# Patient Record
Sex: Female | Born: 1985 | Race: Black or African American | Hispanic: No | Marital: Single | Smoking: Never smoker
Health system: Southern US, Community
[De-identification: ages and names within clinical notes are randomized; demographics above are authoritative.]

---
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
## 2006-04-03 ENCOUNTER — Other Ambulatory Visit: Admission: RE | Admit: 2006-04-03 | Discharge: 2006-04-03 | Payer: Self-pay | Admitting: Unknown Physician Specialty

## 2006-04-03 ENCOUNTER — Encounter (INDEPENDENT_AMBULATORY_CARE_PROVIDER_SITE_OTHER): Payer: Self-pay | Admitting: Specialist

## 2007-02-12 ENCOUNTER — Encounter (INDEPENDENT_AMBULATORY_CARE_PROVIDER_SITE_OTHER): Payer: Self-pay | Admitting: Unknown Physician Specialty

## 2007-02-12 ENCOUNTER — Other Ambulatory Visit: Admission: RE | Admit: 2007-02-12 | Discharge: 2007-02-12 | Payer: Self-pay | Admitting: Unknown Physician Specialty

## 2007-09-01 ENCOUNTER — Emergency Department (HOSPITAL_COMMUNITY): Admission: EM | Admit: 2007-09-01 | Discharge: 2007-09-01 | Payer: Self-pay | Admitting: Emergency Medicine

## 2007-11-26 ENCOUNTER — Other Ambulatory Visit: Admission: RE | Admit: 2007-11-26 | Discharge: 2007-11-26 | Payer: Self-pay | Admitting: Unknown Physician Specialty

## 2007-11-26 ENCOUNTER — Encounter (INDEPENDENT_AMBULATORY_CARE_PROVIDER_SITE_OTHER): Payer: Self-pay | Admitting: Unknown Physician Specialty

## 2008-07-13 ENCOUNTER — Emergency Department (HOSPITAL_COMMUNITY): Admission: EM | Admit: 2008-07-13 | Discharge: 2008-07-13 | Payer: Self-pay | Admitting: Emergency Medicine

## 2009-02-23 IMAGING — CT CT PELVIS W/O CM
1 of 2 series · 16 of 32 positions shown, 20 images · IV contrast (agent unspecified)
Comparison: None.

CLINICAL DATA: Right flank pain.  
 ABDOMEN CT WITHOUT CONTRAST:
TECHNIQUE: Multidetector CT imaging of the abdomen was performed following the standard protocol without IV contrast.
TECHNIQUE: Multidetector CT imaging of the pelvis was performed following the standard protocol without IV contrast.

[Series 2: stone 5.0 b40f · axial · 0.66mm/px · z∈[-243,+122]mm · 16 of 81 slices shown, 20 images]
[im 4/81  soft-tissue]
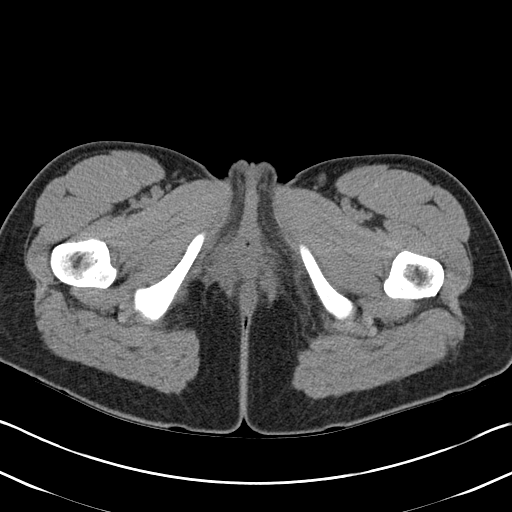
[im 4/81  bone]
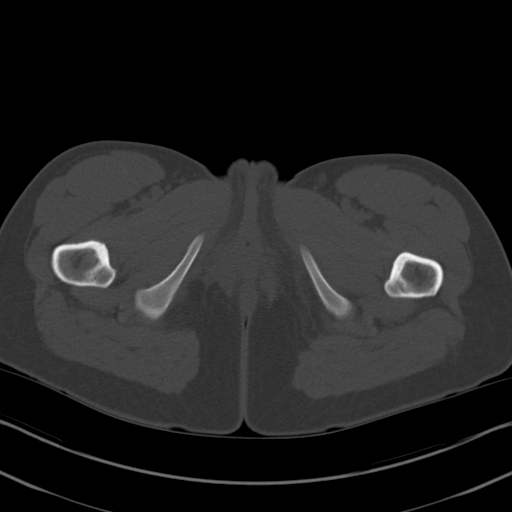
[im 10/81  soft-tissue]
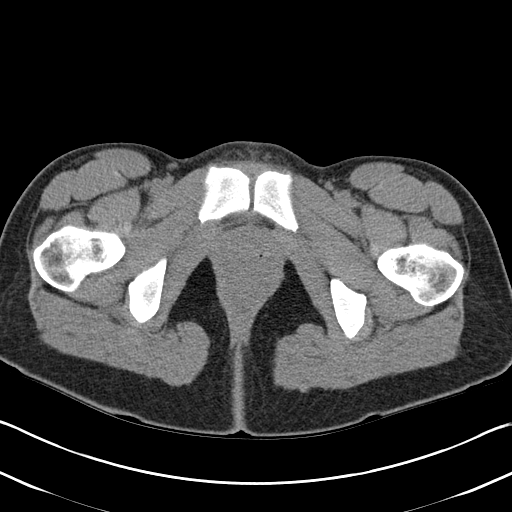
[im 17/81  soft-tissue]
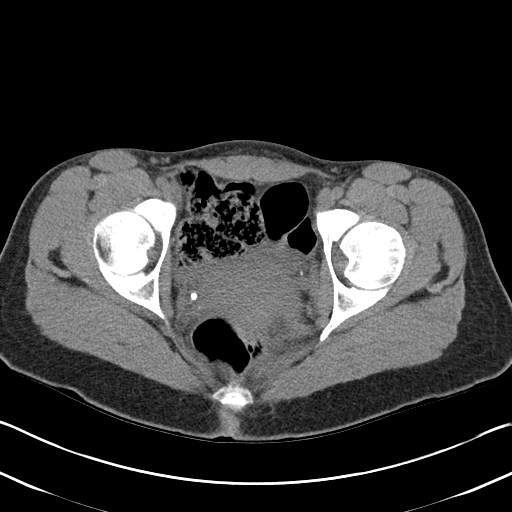
[im 23/81  soft-tissue]
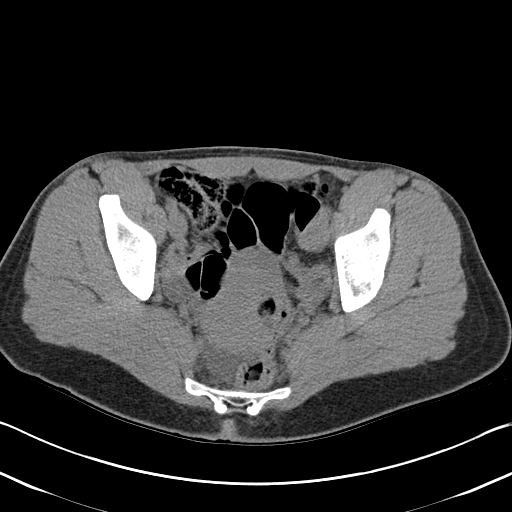
[im 26/81  soft-tissue]
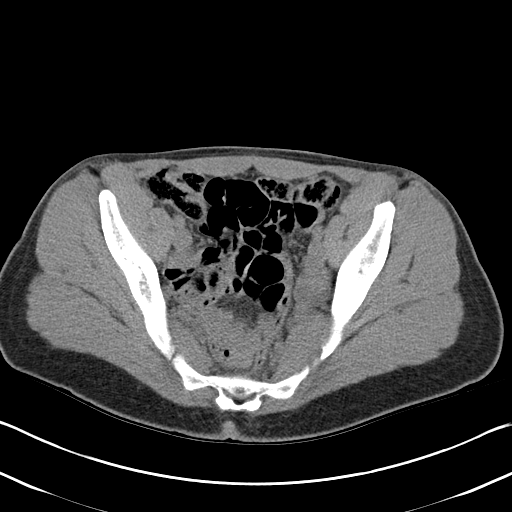
[im 33/81  soft-tissue]
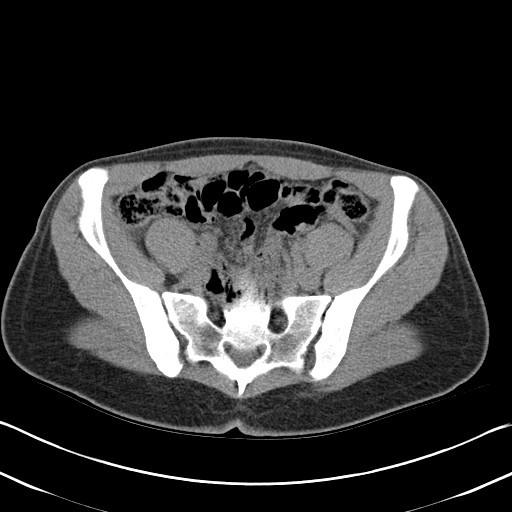
[im 39/81  soft-tissue]
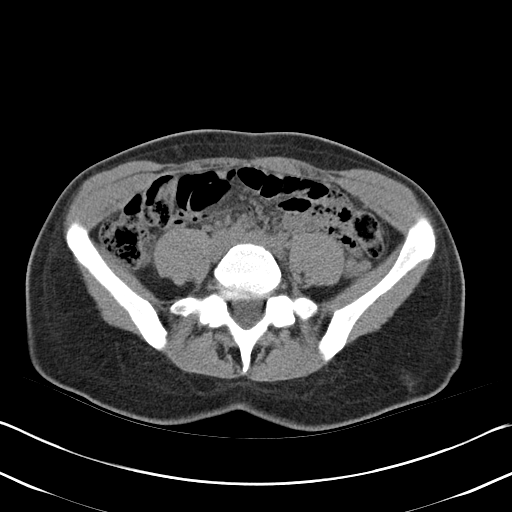
[im 42/81  soft-tissue]
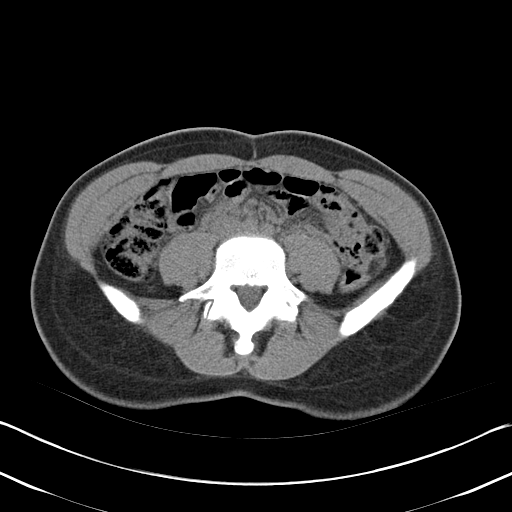
[im 49/81  soft-tissue]
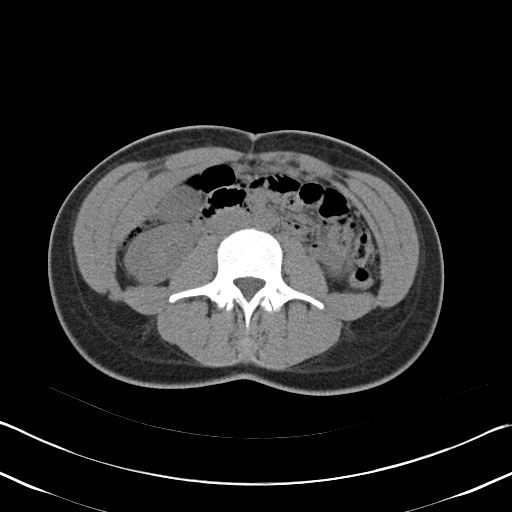
[im 49/81  bone]
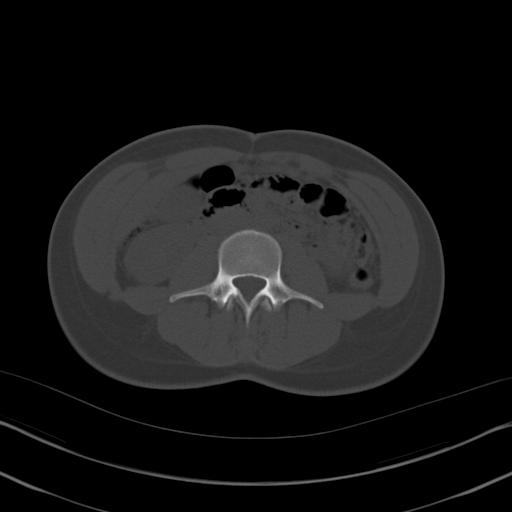
[im 55/81  soft-tissue]
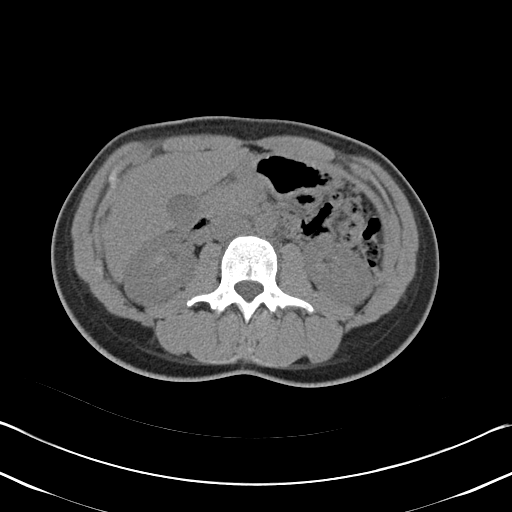
[im 61/81  soft-tissue]
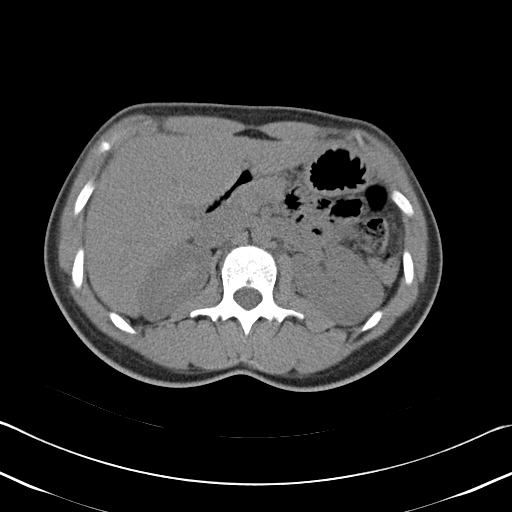
[im 65/81  soft-tissue]
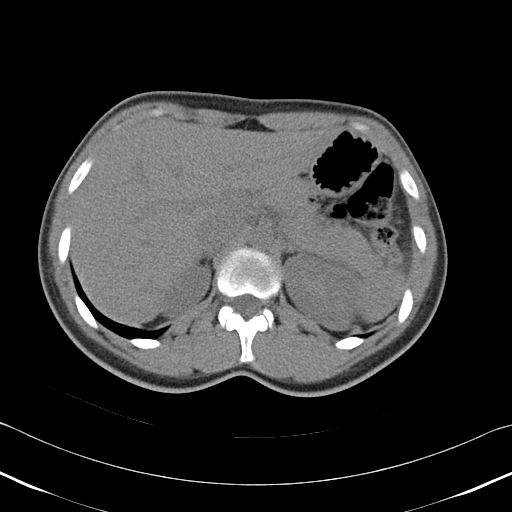
[im 68/81  lung]
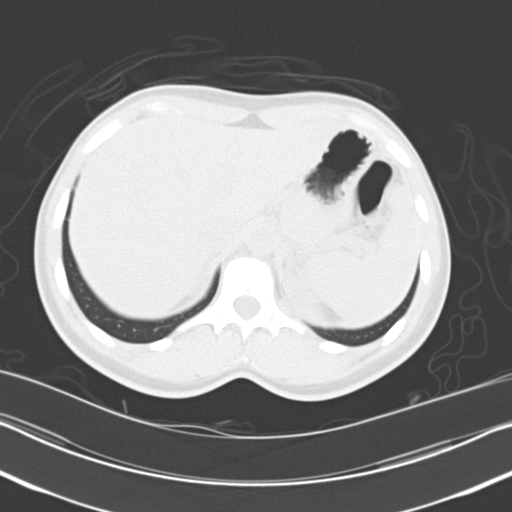
[im 71/81  soft-tissue]
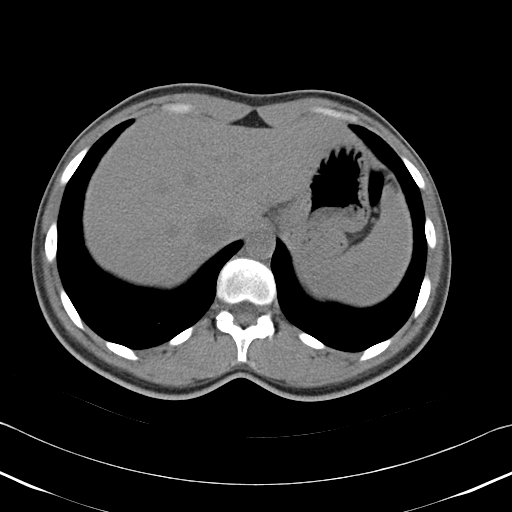
[im 71/81  lung]
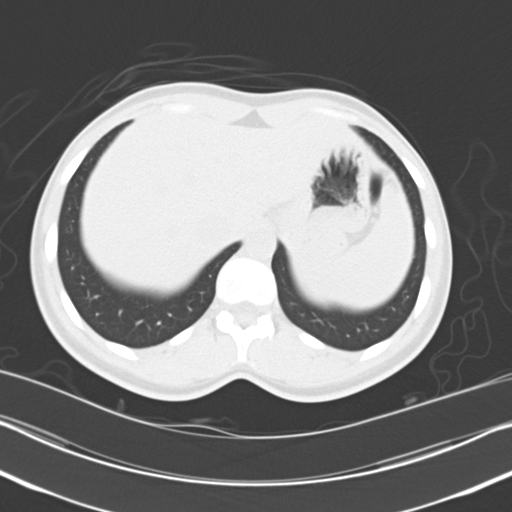
[im 74/81  lung]
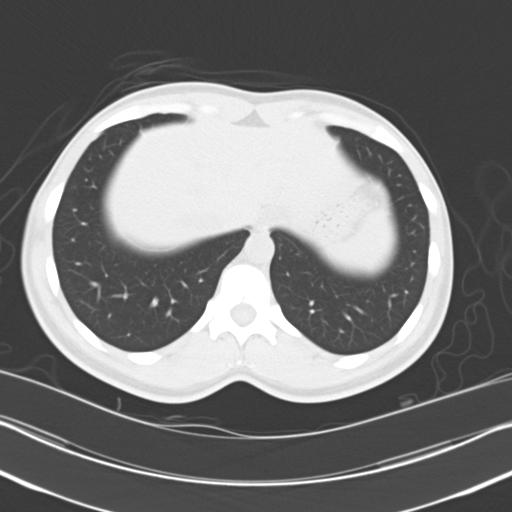
[im 77/81  soft-tissue]
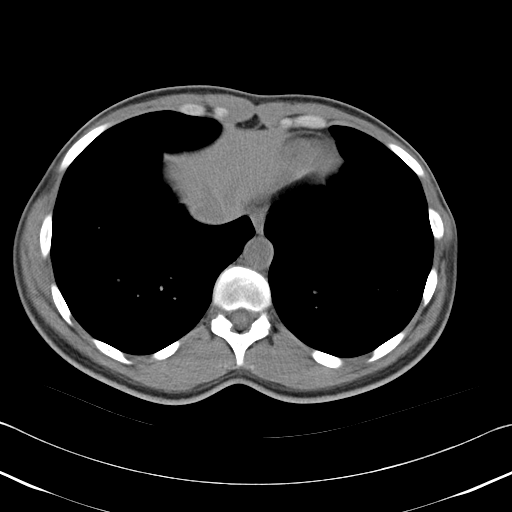
[im 77/81  lung]
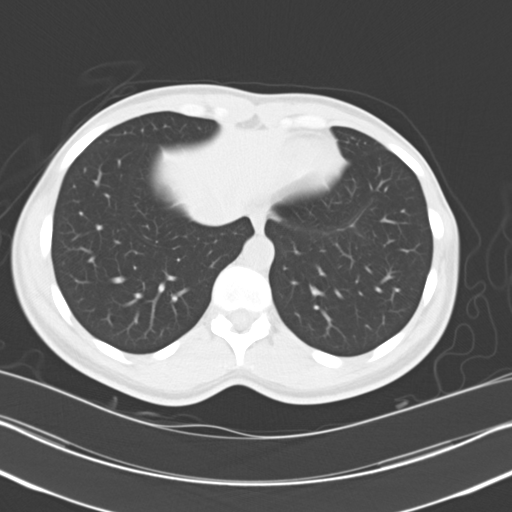

[16 of 32 positions shown; findings below may reference images not displayed]

FINDINGS: There is no hydronephrosis, asymmetrical perinephric stranding or nephrolithiasis.  No ureteral calculi are seen. The liver, gallbladder, spleen, pancreas, and adrenal glands are unremarkable.
IMPRESSION: No evidence of urinary calculus.  No hydronephrosis.  
 PELVIS CT WITHOUT CONTRAST:
FINDINGS: Free fluid is seen in the pelvis.  The uterus, adnexa, and bladder are unremarkable.  Calcifications on either side of the pelvis are noted, nonspecific.
IMPRESSION: Free fluid.  Nonspecific bilateral pelvic calcifications.

## 2010-01-05 IMAGING — CR DG CHEST 1V PORT
1 series · 1 of 1 positions shown · non-contrast
Comparison: Portable exam 8486 hours without priors for comparison.

CLINICAL DATA: Fever, shortness of breath, chest pain

PORTABLE CHEST - 1 VIEW

[view not recorded]
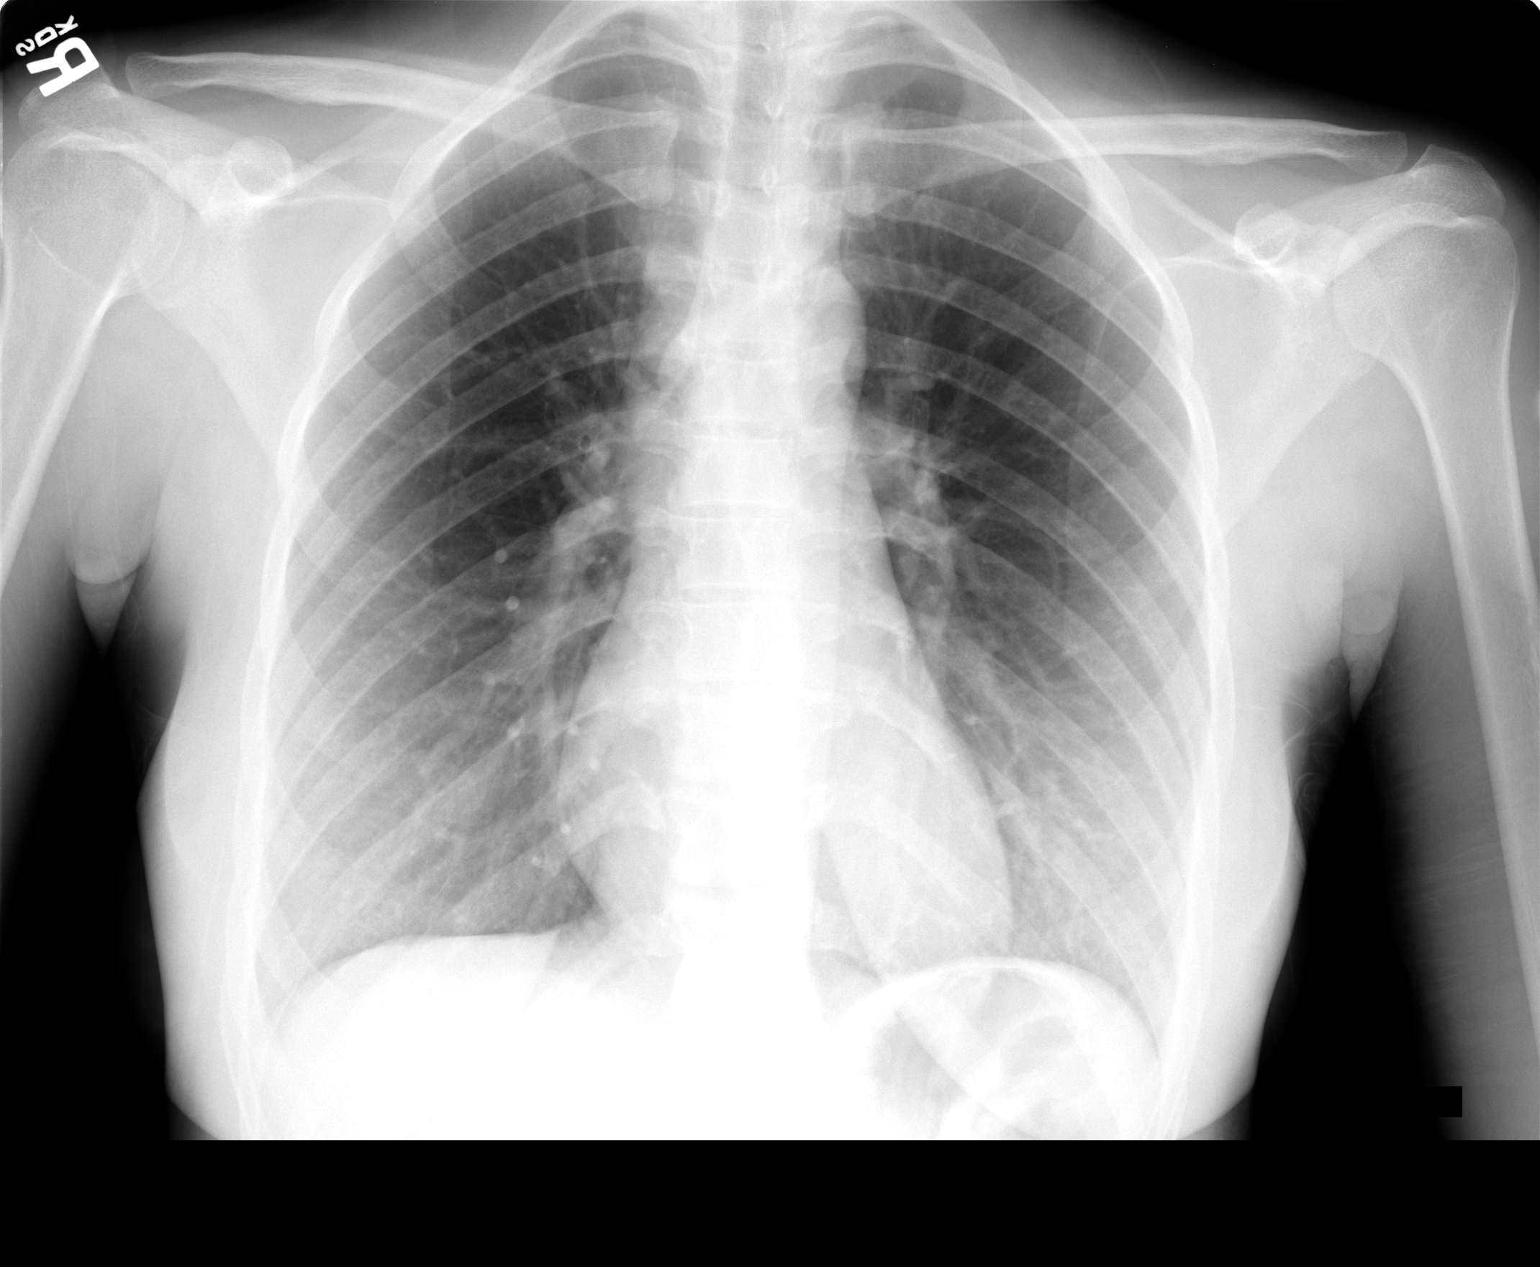

[1 of 1 positions shown; findings below may reference images not displayed]

FINDINGS: Normal heart size, mediastinal contours, and pulmonary vascularity.
Minimal peribronchial thickening without infiltrate or effusion.
No pneumothorax.
Bones unremarkable.
IMPRESSION: Minimal bronchitic changes.

## 2011-07-03 LAB — BASIC METABOLIC PANEL
BUN: 6
Creatinine, Ser: 0.71
GFR calc Af Amer: >60
GFR calc non Af Amer: >60
Glucose, Bld: 87
Potassium: 4.2

## 2011-07-03 LAB — DIFFERENTIAL
Basophils Absolute: 0
Eosinophils Relative: 2
Lymphocytes Relative: 34
Monocytes Absolute: 0.4
Monocytes Relative: 8
Neutrophils Relative %: 56

## 2011-07-03 LAB — URINALYSIS, ROUTINE W REFLEX MICROSCOPIC
Glucose, UA: NEGATIVE
pH: 5

## 2011-07-03 LAB — CBC
HCT: 38
Hemoglobin: 12.9
MCV: 88.9
RBC: 4.28
WBC: 5.3

## 2011-07-03 LAB — PREGNANCY, URINE: Preg Test, Ur: NEGATIVE

## 2013-12-09 ENCOUNTER — Ambulatory Visit: Payer: Self-pay | Admitting: Podiatry

## 2013-12-18 ENCOUNTER — Encounter: Payer: Self-pay | Admitting: Podiatry

## 2013-12-18 ENCOUNTER — Ambulatory Visit (INDEPENDENT_AMBULATORY_CARE_PROVIDER_SITE_OTHER): Payer: BC Managed Care – PPO

## 2013-12-18 ENCOUNTER — Ambulatory Visit (INDEPENDENT_AMBULATORY_CARE_PROVIDER_SITE_OTHER): Payer: BC Managed Care – PPO | Admitting: Podiatry

## 2013-12-18 DIAGNOSIS — M722 Plantar fascial fibromatosis: Secondary | ICD-10-CM

## 2013-12-18 DIAGNOSIS — R52 Pain, unspecified: Secondary | ICD-10-CM

## 2013-12-18 MED ORDER — METHYLPREDNISOLONE (PAK) 4 MG PO TABS: ORAL_TABLET | ORAL | Status: DC

## 2013-12-18 MED ORDER — MELOXICAM 15 MG PO TABS: 15.0000 mg | ORAL_TABLET | Freq: Every day | ORAL | Status: DC

## 2013-12-18 NOTE — Progress Notes (Signed)
   Subjective:    Patient ID: Julia Christian, female    DOB: [birth year], 28 y.o.   MRN: [id number]  HPI PT STATED BOTH FEET HAVING SHARP PAIN GOING UP THE LEG AND THIS BEEN GOING FOR 1 YEAR. THE FEET ARE GETTING WORSE. THE FEET GET AGGRAVATED WALKING/ SITTING. TRIED TO TAKE TYLENOL AND SOAK WITH EPSON SALT  BUT DID NOT HELP.    Review of Systems  HENT: Positive for sinus pressure.   Eyes: Positive for itching.  All other systems reviewed and are negative.       Objective:   Physical Exam: I have reviewed her past medical history medications allergies surgeries and social history. Review of systems is unremarkable. Vital signs are stable alert and oriented x3. Vascular evaluation demonstrates +2/4 DPPT bilateral. Capillary fill time to digits one through 5 is immediate. Neurologic sensorium is intact per Semmes-Weinstein monofilament. Deep tendon reflexes are brisk and equal bilateral. Muscle strength is 5 over 5 dorsiflexors plantar flexors inverters everters all intrinsic musculature is intact orthopedic evaluation demonstrates all joints distal to the ankle a full range of motion without crepitus mild pes planus is noted bilateral mild tenderness on palpation of the posterior tibial tendon bilaterally. Pain on palpation medial calcaneal tubercle at the plantar fascial calcaneal insertion sites bilateral but minimally radiographic evaluation does demonstrate after mentioned pes planus with soft tissue increase in density at the plantar fascial calcaneal insertion site. Cutaneous evaluation demonstrates no erythema no edema no cellulitis drainage or odor.        Assessment & Plan:  Assessment: Plantar fasciitis bilateral.  Plan: Discussed etiology pathology conservative versus surgical therapies. I wrote a prescription for Medrol Dosepak today to be followed by my big.

## 2014-01-06 ENCOUNTER — Encounter: Payer: Self-pay | Admitting: Podiatry

## 2014-01-06 ENCOUNTER — Ambulatory Visit (INDEPENDENT_AMBULATORY_CARE_PROVIDER_SITE_OTHER): Payer: BC Managed Care – PPO | Admitting: Podiatry

## 2014-01-06 VITALS — BP 117/70 | HR 83 | Resp 14 | Ht 68.0 in | Wt 150.0 lb

## 2014-01-06 DIAGNOSIS — R52 Pain, unspecified: Secondary | ICD-10-CM

## 2014-01-06 DIAGNOSIS — M722 Plantar fascial fibromatosis: Secondary | ICD-10-CM

## 2014-01-06 NOTE — Progress Notes (Signed)
   Subjective:    Patient ID: Julia Christian, female    DOB: 1985/12/23, 28 y.o.   MRN: [id number]  HPI Comments: Pt states she can tell a difference in the severity, and now has pain in the right medial midfoot, started on Friday.  Foot Pain      Review of Systems     Objective:   Physical Exam: Pulses remain strongly palpable today she has tenderness on palpation of the posterior tibial tendons. Under fascial pain as well.        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  Plan: She was scan today for pair orthotics. She will continue all other conservative therapies until her orthotics committed.

## 2014-01-08 ENCOUNTER — Ambulatory Visit: Payer: BC Managed Care – PPO | Admitting: Podiatry

## 2014-03-02 ENCOUNTER — Telehealth: Payer: Self-pay | Admitting: *Deleted

## 2014-03-02 NOTE — Telephone Encounter (Signed)
It's been one month when I was scanned for inserts for my shoes.  I haven't heard anything from anybody.  I called and left her a message that the orthotics are here.  Please give [place] a call to schedule an appointment to pick them up.

## 2014-03-24 ENCOUNTER — Ambulatory Visit (INDEPENDENT_AMBULATORY_CARE_PROVIDER_SITE_OTHER): Payer: BC Managed Care – PPO | Admitting: Podiatry

## 2014-03-24 ENCOUNTER — Encounter: Payer: Self-pay | Admitting: Podiatry

## 2014-03-24 VITALS — BP 108/67 | HR 86 | Resp 14 | Ht 68.0 in | Wt 152.0 lb

## 2014-03-24 DIAGNOSIS — M722 Plantar fascial fibromatosis: Secondary | ICD-10-CM

## 2014-03-24 MED ORDER — OXYCODONE-ACETAMINOPHEN 10-325 MG PO TABS: 1.0000 | ORAL_TABLET | Freq: Three times a day (TID) | ORAL | Status: AC | PRN

## 2014-03-24 NOTE — Progress Notes (Signed)
She presents today for a pick up orthotics. She's also complaining of pain to her foot to radiates of her leg from her medial aspect of her left heel. States she may benefit over the weekend while playing volleyball.  Objective: Vital signs are stable she is alert and oriented x3 she has pain on palpation to the posterior tibial tendon as well as the medial band of the plantar fascia.  Assessment: Plantar fasciitis posterior tibial tendinitis pes planus bilateral.  Plan

## 2014-03-24 NOTE — Patient Instructions (Signed)

## 2014-04-21 ENCOUNTER — Ambulatory Visit: Payer: BC Managed Care – PPO | Admitting: Podiatry

## 2014-06-18 ENCOUNTER — Other Ambulatory Visit: Payer: Self-pay | Admitting: Podiatry

## 2014-08-06 ENCOUNTER — Ambulatory Visit (INDEPENDENT_AMBULATORY_CARE_PROVIDER_SITE_OTHER): Payer: BC Managed Care – PPO | Admitting: Podiatry

## 2014-08-06 VITALS — BP 133/76 | HR 105 | Resp 16

## 2014-08-06 DIAGNOSIS — M069 Rheumatoid arthritis, unspecified: Secondary | ICD-10-CM

## 2014-08-06 MED ORDER — CELECOXIB 200 MG PO CAPS: 200.0000 mg | ORAL_CAPSULE | Freq: Two times a day (BID) | ORAL | Status: AC

## 2014-08-07 LAB — SEDIMENTATION RATE: Sed Rate: 8 mm/hr (ref 0–22)

## 2014-08-07 LAB — ANA: Anti Nuclear Antibody(ANA): NEGATIVE

## 2014-08-07 LAB — RHEUMATOID FACTOR: Rhuematoid fact SerPl-aCnc: <10 IU/mL (ref <=14)

## 2014-08-07 LAB — URIC ACID: Uric Acid, Serum: 3.6 mg/dL (ref 2.4–7.0)

## 2014-08-07 LAB — C-REACTIVE PROTEIN: CRP: 1.2 mg/dL — AB (ref <0.60)

## 2014-08-07 NOTE — Progress Notes (Signed)
She presents today states that her bilateral heels and ankles and legs are hurting worse she states this seems to be worse with the orthotics. Nothing seems to be helping.  Objective: Vital signs are stable she is alert and oriented 3 she has pain on palpation medial calcaneal tubercles bilateral pain on palpation lateral aspect of the foot and the fifth metatarsal cuboid articulation as well as the sinus tarsi and the Achilles tendon.  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  Plan: We will send her out for an arthritic profile and physical therapy.

## 2014-08-10 ENCOUNTER — Telehealth: Payer: Self-pay | Admitting: *Deleted

## 2014-08-10 NOTE — Telephone Encounter (Signed)
Spoke to Julia Christian about her blood work , everything looks ok

## 2014-08-10 NOTE — Telephone Encounter (Signed)
-----   Message from Elinor Parkinson, [place] sent at 08/07/2014 12:03 PM EST ----- Blood work looks ok.

## 2014-08-12 ENCOUNTER — Telehealth: Payer: Self-pay | Admitting: *Deleted

## 2014-08-12 NOTE — Telephone Encounter (Signed)
Referral sent to [facility] 

## 2014-08-25 ENCOUNTER — Telehealth: Payer: Self-pay | Admitting: *Deleted

## 2014-08-25 NOTE — Telephone Encounter (Signed)
We should try to contact the patient and let her know that we are asking for her to go to PT as a part of her treatment plan.

## 2014-08-25 NOTE — Telephone Encounter (Signed)
[facility] called states they were not able to schedule pt after 3 call attempts.

## 2014-08-26 NOTE — Telephone Encounter (Signed)
[name], please contact this pt and inform PT is part of her treatment plan, and have her contact [facility].  Julia Christian

## 2014-08-26 NOTE — Telephone Encounter (Signed)
I attempted to call the patient to inform her that Physical Therapy is recommended treatment please call [facility].

## 2014-08-26 NOTE — Telephone Encounter (Signed)
Please have her contact and set up PT.

## 2014-09-02 ENCOUNTER — Telehealth: Payer: Self-pay | Admitting: *Deleted

## 2014-09-02 NOTE — Telephone Encounter (Signed)
I got in contact with the patient in regards to making a physical therapy appointment.  She stated, "I developed a sinus infection when I left from seeing you all.  The following week I got Shingles.  I plan on calling them to schedule an appointment now and then scheduling a follow-up with you guys."  I'm glad you are feeling better.  I will let Dr. Al Corpus know.  "Okay, thank you."
